# Patient Record
Sex: Male | Born: 1985 | Hispanic: Yes | Marital: Married
Health system: Southern US, Community
[De-identification: ages and names within clinical notes are randomized; demographics above are authoritative.]

---

## 2006-01-01 ENCOUNTER — Emergency Department: Payer: Self-pay | Admitting: Emergency Medicine

## 2008-10-10 IMAGING — CR RIGHT ELBOW - COMPLETE 3+ VIEW
1 series · 4 of 4 positions shown · non-contrast
Comparison: none

REASON FOR EXAM: fell, elbow pain
COMMENTS:  LMP: (Male)

[Series 1: view not recorded · 0.17mm/px · 4 of 4 slices shown]
[im 1/4]
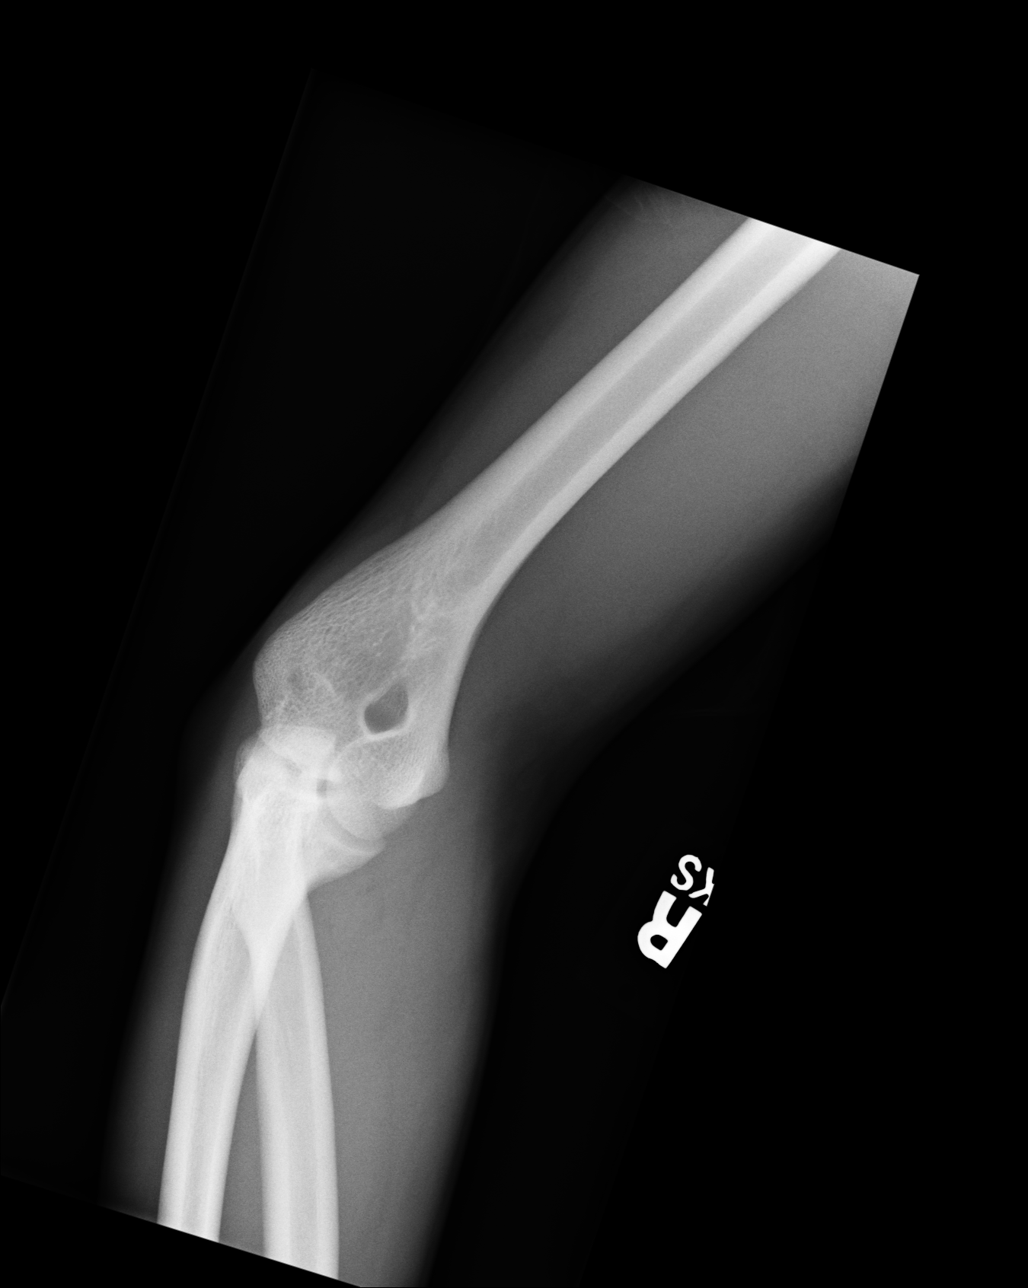
[im 2/4]
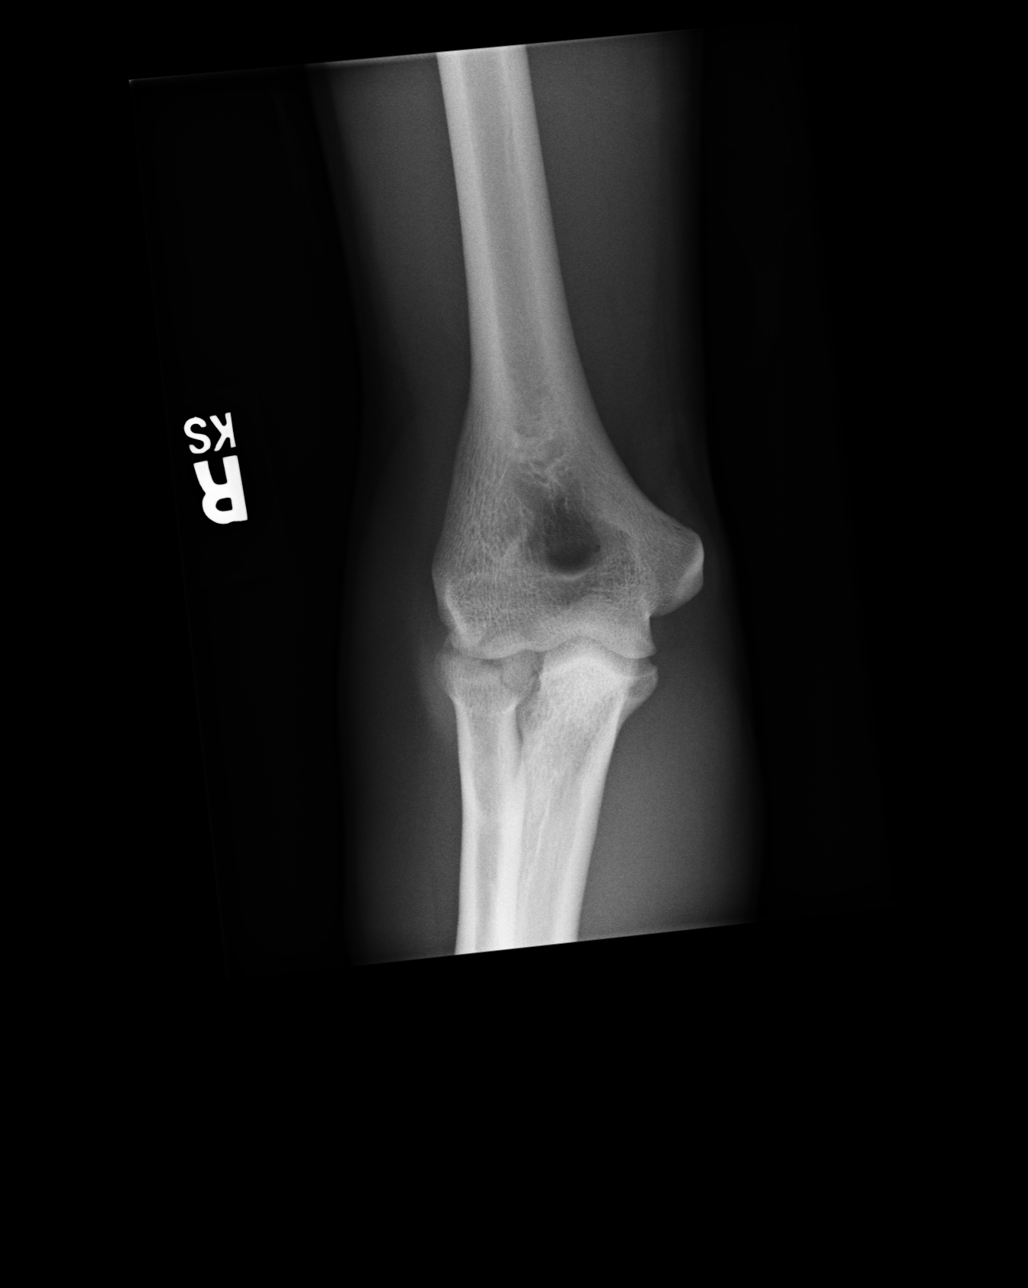
[im 3/4]
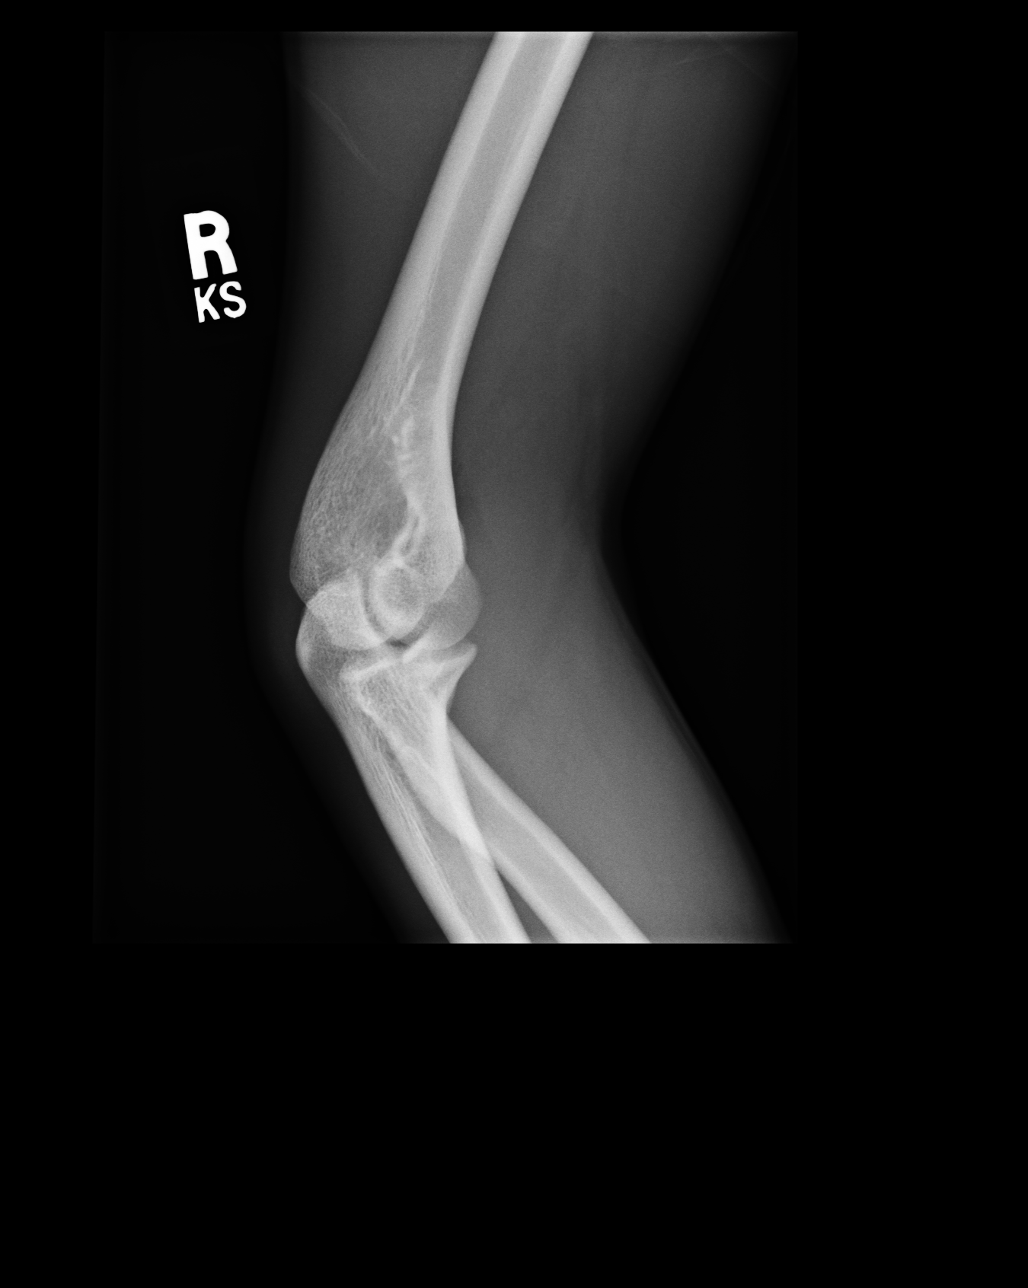
[im 4/4]
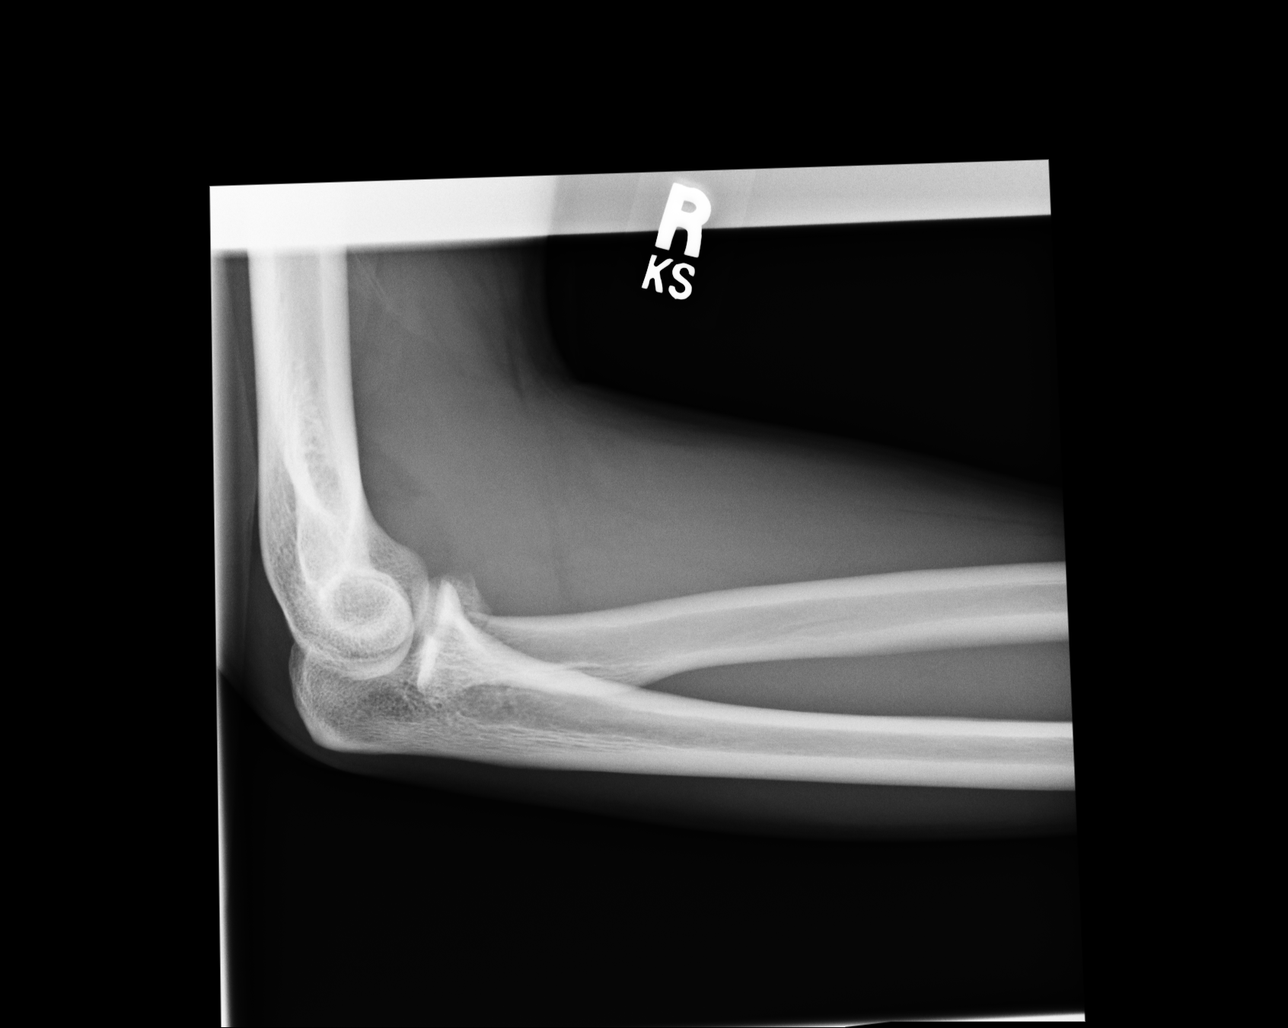

[4 of 4 positions shown; findings below may reference images not displayed]

PROCEDURE:     DXR - DXR ELBOW RT COMP W/OBLIQUES  - January 01, 2006  [DATE]

RESULT:          A radial head fracture is appreciated which demonstrates an
element of posterior angulation.  There is intraarticular extension of the
fracture into the proximal radioulnar joint as well as the humeroradial
joint.
IMPRESSION: A radial head fracture as described above.

## 2015-04-01 ENCOUNTER — Telehealth: Payer: Self-pay | Admitting: Psychiatry

## 2015-04-01 NOTE — Telephone Encounter (Signed)
Received a call from social work at [facility] in [place]. Social worker Joni Reiningicole contacted this clinic to schedule the patient for follow-up. Per Joni ReiningNicole the patient has been with them since January 4 and presented in a manic state. Joni Reiningicole indicated that there was another hospitalization in [place] prior to the current one in which she was admitted for being depressed. Per Joni ReiningNicole patient is stable and no longer manic and wants to return to [place]. I have requested that Joni Reiningicole send us the discharge summary and she agreed to do so. I have transferred to call to hold the patient for follow-up. AW
